# Patient Record
Sex: Female | Born: 1980 | Race: White | Hispanic: No | Marital: Single | Smoking: Current every day smoker
Health system: Southern US, Community
[De-identification: ages and names within clinical notes are randomized; demographics above are authoritative.]

## PROBLEM LIST (undated history)

## (undated) DIAGNOSIS — N289 Disorder of kidney and ureter, unspecified: Secondary | ICD-10-CM

## (undated) HISTORY — PX: OTHER SURGICAL HISTORY: SHX169

---

## 2014-11-14 ENCOUNTER — Emergency Department (HOSPITAL_COMMUNITY): Payer: Medicaid Other

## 2014-11-14 ENCOUNTER — Encounter (HOSPITAL_COMMUNITY): Payer: Self-pay | Admitting: Emergency Medicine

## 2014-11-14 ENCOUNTER — Emergency Department (HOSPITAL_COMMUNITY)
Admission: EM | Admit: 2014-11-14 | Discharge: 2014-11-14 | Disposition: A | Discharge status: Home / Self Care | Payer: Medicaid Other | Attending: Emergency Medicine | Admitting: Emergency Medicine

## 2014-11-14 DIAGNOSIS — R0789 Other chest pain: Secondary | ICD-10-CM | POA: Diagnosis not present

## 2014-11-14 DIAGNOSIS — R079 Chest pain, unspecified: Secondary | ICD-10-CM | POA: Diagnosis present

## 2014-11-14 MED ORDER — IBUPROFEN 800 MG PO TABS
800.0000 mg | ORAL_TABLET | Freq: Once | ORAL | Status: AC
  Administered 2014-11-14: 800 mg via ORAL
  Filled 2014-11-14: qty 1

## 2014-11-14 NOTE — Discharge Instructions (Signed)
Chest Pain (Nonspecific) Contact your local health department to get a primary care physician. Ask your new primary care physician to help you to stop smoking. Take Tylenol or Advil as directed for pain It is often hard to give a diagnosis for the cause of chest pain. There is always a chance that your pain could be related to something serious, such as a heart attack or a blood clot in the lungs. You need to follow up with your doctor. HOME CARE  If antibiotic medicine was given, take it as directed by your doctor. Finish the medicine even if you start to feel better.  For the next few days, avoid activities that bring on chest pain. Continue physical activities as told by your doctor.  Do not use any tobacco products. This includes cigarettes, chewing tobacco, and e-cigarettes.  Avoid drinking alcohol.  Only take medicine as told by your doctor.  Follow your doctor's suggestions for more testing if your chest pain does not go away.  Keep all doctor visits you made. GET HELP IF:  Your chest pain does not go away, even after treatment.  You have a rash with blisters on your chest.  You have a fever. GET HELP RIGHT AWAY IF:   You have more pain or pain that spreads to your arm, neck, jaw, back, or belly (abdomen).  You have shortness of breath.  You cough more than usual or cough up blood.  You have very bad back or belly pain.  You feel sick to your stomach (nauseous) or throw up (vomit).  You have very bad weakness.  You pass out (faint).  You have chills. This is an emergency. Do not wait to see if the problems will go away. Call your local emergency services (911 in U.S.). Do not drive yourself to the hospital. MAKE SURE YOU:   Understand these instructions.  Will watch your condition.  Will get help right away if you are not doing well or get worse. Document Released: 09/14/2007 Document Revised: 04/02/2013 Document Reviewed: 09/14/2007 [facility] Patient  Information 2015 [place], [place]. This information is not intended to replace advice given to you by your health care provider. Make sure you discuss any questions you have with your health care provider.

## 2014-11-14 NOTE — ED Notes (Signed)
Pt states that she has been having chest pain for over a month.  States feels that the shortness of breath has gotten worse over the past week.  Recently drove from [place].

## 2014-11-14 NOTE — ED Provider Notes (Signed)
CSN: [id number]     Arrival date & time 11/14/14  1000 History   This chart was scribed for Doug Sou, MD by Evon Slack, ED Scribe. This patient was seen in room APA04/APA04 and the patient's care was started at 10:06 AM.      Chief Complaint  Patient presents with  . Chest Pain   The history is provided by the patient. No language interpreter was used.   HPI Comments: Brytani Voth is a 34 y.o. female who presents to the Emergency Department complaining of pinching CP onset 1 month prior. Pt states that she does have some intermittent tingling in her left hand. Pt states that she did notice pain is pleuritic.  Pt states that the pain is worse at night. Constant for one month. Denies sob or cough . Pain covers approximately 2 cm area of her left anterior chest. Pt states that she has been taking tylenol codeine and ibuprofen with slight relief. Pt does report recent drive from [place] 2 days ago. Pt states she has a Hx of chest wall pain since the age of 62. Hx of kidney stones. Tobacco and alcohol use occasionally. No illicit drug use. LMP 10/22/2014  No past medical history on file. past medical history chest wall pain No past surgical history on file. family history negative for coronary artery disease No family history on file. History  Substance Use Topics  . Smoking status: Not on file  . Smokeless tobacco: Not on file  . Alcohol Use: Not on file   positive smoker occasional alcohol no illicit drug use OB History    No data available     Review of Systems  Constitutional: Negative.   HENT: Negative.   Cardiovascular: Positive for chest pain.  Gastrointestinal: Negative.   Musculoskeletal: Negative.   Skin: Negative.   Neurological: Negative.   Psychiatric/Behavioral: Negative.   All other systems reviewed and are negative.    Allergies  Review of patient's allergies indicates not on file.  Home Medications   Prior to Admission medications   Not on File   BP  115/77 mmHg  Pulse 94  Temp(Src) 98 F (36.7 C) (Oral)  Resp 16  Ht 5\' 6"  (1.676 m)  Wt 154 lb (69.854 kg)  BMI 24.87 kg/m2  SpO2 97%  LMP 10/22/2014   Physical Exam  Constitutional: She appears well-developed and well-nourished.  HENT:  Head: Normocephalic and atraumatic.  Eyes: Conjunctivae are normal. Pupils are equal, round, and reactive to light.  Neck: Neck supple. No tracheal deviation present. No thyromegaly present.  Cardiovascular: Normal rate and regular rhythm.   No murmur heard. Pulmonary/Chest: Effort normal and breath sounds normal. She exhibits no tenderness.  Tender left anterior chest wall, reproducing pain exactly. Pain is also reproduced by forcible flexion of left shoulder.  Abdominal: Soft. Bowel sounds are normal. She exhibits no distension. There is no tenderness.  Musculoskeletal: Normal range of motion. She exhibits no edema or tenderness.  Neurological: She is alert. Coordination normal.  Skin: Skin is warm and dry. No rash noted.  Psychiatric: She has a normal mood and affect.  Nursing note and vitals reviewed.   ED Course  Procedures (including critical care time) DIAGNOSTIC STUDIES: Oxygen Saturation is 97% on RA, normal by my interpretation.    COORDINATION OF CARE: 10:18 AM-Discussed treatment plan with pt at bedside and pt agreed to plan.     Labs Review Labs Reviewed - No data to display  Imaging Review Dg Chest 2  View  11/14/2014   CLINICAL DATA:  Chest pain.  Tingling in the left fingers.  EXAM: CHEST - 2 VIEW  COMPARISON:  Two-view chest radiograph 01/04/2011.  FINDINGS: The heart size and mediastinal contours are within normal limits. Both lungs are clear. The visualized skeletal structures are unremarkable.  IMPRESSION: Negative two view chest x-ray   Electronically Signed   By: Marin Roberts M.D.   On: 11/14/2014 10:44     EKG Interpretation   Date/Time:  Friday November 14 2014 10:09:28 EDT Ventricular Rate:  92 PR  Interval:  128 QRS Duration: 103 QT Interval:  359 QTC Calculation: 444 R Axis:   66 Text Interpretation:  Sinus rhythm RSR' in V1 or V2, right VCD or RVH No  old tracing to compare Confirmed by Johm Pfannenstiel  MD, Negar Sieler [phone]) on  11/14/2014 10:18:05 AM     11:40 AM feels improved after treatment with ibuprofen. I counseled patient for 5 minutes on smoking cessation Chest x-ray viewed by me No results found for this or any previous visit. Dg Chest 2 View  11/14/2014   CLINICAL DATA:  Chest pain.  Tingling in the left fingers.  EXAM: CHEST - 2 VIEW  COMPARISON:  Two-view chest radiograph 01/04/2011.  FINDINGS: The heart size and mediastinal contours are within normal limits. Both lungs are clear. The visualized skeletal structures are unremarkable.  IMPRESSION: Negative two view chest x-ray   Electronically Signed   By: Marin Roberts M.D.   On: 11/14/2014 10:44    MDM  PERC neg. low pretest clinical suspicion for pulmonary embolism. Doubt acute coronary syndrome. Highly atypical symptoms. Near normal EKG. Only cardiac risk factor smoking Final diagnoses:  None   Plan Tylenol or Advil for pain. Referral to local health department to get primary care physician Plan Diagnosis #1 atypical chest pain #2 tobacco abuse    I personally performed the services described in this documentation, which was scribed in my presence. The recorded information has been reviewed and considered.   Doug Sou, MD 11/14/14 1144

## 2014-11-17 ENCOUNTER — Emergency Department (HOSPITAL_COMMUNITY)
Admission: EM | Admit: 2014-11-17 | Discharge: 2014-11-17 | Disposition: A | Discharge status: Home / Self Care | Payer: Medicaid Other | Attending: Emergency Medicine | Admitting: Emergency Medicine

## 2014-11-17 ENCOUNTER — Encounter (HOSPITAL_COMMUNITY): Payer: Self-pay | Admitting: Emergency Medicine

## 2014-11-17 DIAGNOSIS — Z720 Tobacco use: Secondary | ICD-10-CM | POA: Insufficient documentation

## 2014-11-17 DIAGNOSIS — R1032 Left lower quadrant pain: Secondary | ICD-10-CM | POA: Insufficient documentation

## 2014-11-17 DIAGNOSIS — Z87448 Personal history of other diseases of urinary system: Secondary | ICD-10-CM | POA: Diagnosis not present

## 2014-11-17 DIAGNOSIS — R350 Frequency of micturition: Secondary | ICD-10-CM | POA: Diagnosis not present

## 2014-11-17 DIAGNOSIS — R0789 Other chest pain: Secondary | ICD-10-CM | POA: Insufficient documentation

## 2014-11-17 DIAGNOSIS — R1031 Right lower quadrant pain: Secondary | ICD-10-CM | POA: Insufficient documentation

## 2014-11-17 DIAGNOSIS — R0602 Shortness of breath: Secondary | ICD-10-CM | POA: Insufficient documentation

## 2014-11-17 DIAGNOSIS — R079 Chest pain, unspecified: Secondary | ICD-10-CM | POA: Diagnosis present

## 2014-11-17 HISTORY — DX: Disorder of kidney and ureter, unspecified: N28.9

## 2014-11-17 LAB — CBC WITH DIFFERENTIAL/PLATELET
BASOS ABS: 0 10*3/uL (ref 0.0–0.1)
Basophils Relative: 0 % (ref 0–1)
EOS ABS: 0.1 10*3/uL (ref 0.0–0.7)
EOS PCT: 1 % (ref 0–5)
HCT: 37.5 % (ref 36.0–46.0)
Hemoglobin: 12.3 g/dL (ref 12.0–15.0)
Lymphocytes Relative: 34 % (ref 12–46)
Lymphs Abs: 2 10*3/uL (ref 0.7–4.0)
MCH: 30.4 pg (ref 26.0–34.0)
MCHC: 32.8 g/dL (ref 30.0–36.0)
MCV: 92.8 fL (ref 78.0–100.0)
Monocytes Absolute: 0.3 10*3/uL (ref 0.1–1.0)
Monocytes Relative: 5 % (ref 3–12)
NEUTROS ABS: 3.5 10*3/uL (ref 1.7–7.7)
Neutrophils Relative %: 60 % (ref 43–77)
Platelets: 211 10*3/uL (ref 150–400)
RBC: 4.04 MIL/uL (ref 3.87–5.11)
RDW: 12.6 % (ref 11.5–15.5)
WBC: 5.8 10*3/uL (ref 4.0–10.5)

## 2014-11-17 LAB — BASIC METABOLIC PANEL
ANION GAP: 9 (ref 5–15)
BUN: 11 mg/dL (ref 6–20)
CALCIUM: 9.2 mg/dL (ref 8.9–10.3)
CHLORIDE: 102 mmol/L (ref 101–111)
CO2: 27 mmol/L (ref 22–32)
CREATININE: 0.65 mg/dL (ref 0.44–1.00)
GFR calc non Af Amer: >60 mL/min (ref >60)
GLUCOSE: 104 mg/dL — AB (ref 65–99)
Potassium: 3.6 mmol/L (ref 3.5–5.1)
SODIUM: 138 mmol/L (ref 135–145)

## 2014-11-17 LAB — TROPONIN I: Troponin I: <0.03 ng/mL (ref <0.031)

## 2014-11-17 MED ORDER — NAPROXEN 500 MG PO TABS: 500.0000 mg | ORAL_TABLET | Freq: Two times a day (BID) | ORAL | Status: AC

## 2014-11-17 MED ORDER — NAPROXEN 250 MG PO TABS
500.0000 mg | ORAL_TABLET | Freq: Once | ORAL | Status: AC
  Administered 2014-11-17: 500 mg via ORAL
  Filled 2014-11-17: qty 2

## 2014-11-17 NOTE — Discharge Instructions (Signed)
Chest Wall Pain Chest wall pain is pain felt in or around the chest bones and muscles. It may take up to 6 weeks to get better. It may take longer if you are active. Chest wall pain can happen on its own. Other times, things like germs, injury, coughing, or exercise can cause the pain. HOME CARE   Avoid activities that make you tired or cause pain. Try not to use your chest, belly (abdominal), or side muscles. Do not use heavy weights.  Put ice on the sore area.  Put ice in a plastic bag.  Place a towel between your skin and the bag.  Leave the ice on for 15-20 minutes for the first 2 days.  Only take medicine as told by your doctor. GET HELP RIGHT AWAY IF:   You have more pain or are very uncomfortable.  You have a fever.  Your chest pain gets worse.  You have new problems.  You feel sick to your stomach (nauseous) or throw up (vomit).  You start to sweat or feel lightheaded.  You have a cough with mucus (phlegm).  You cough up blood. MAKE SURE YOU:   Understand these instructions.  Will watch your condition.  Will get help right away if you are not doing well or get worse. Document Released: 09/14/2007 Document Revised: 06/20/2011 Document Reviewed: 11/22/2010 [facility] Patient Information 2015 [place], [place]. This information is not intended to replace advice given to you by your health care provider. Make sure you discuss any questions you have with your health care provider.  Take the Naprosyn as directed for the next 7 days. Resource guide provided below if symptoms have not improved will need to find a physician to follow-up with. Return for any new or worse symptoms.   Emergency Department Resource Guide 1) Find a Doctor and Pay Out of Pocket Although you won't have to find out who is covered by your insurance plan, it is a good idea to ask around and get recommendations. You will then need to call the office and see if the doctor you have chosen will accept you  as a new patient and what types of options they offer for patients who are self-pay. Some doctors offer discounts or will set up payment plans for their patients who do not have insurance, but you will need to ask so you aren't surprised when you get to your appointment.  2) Contact Your Local Health Department Not all health departments have doctors that can see patients for sick visits, but many do, so it is worth a call to see if yours does. If you don't know where your local health department is, you can check in your phone book. The CDC also has a tool to help you locate your state's health department, and many state websites also have listings of all of their local health departments.  3) Find a Walk-in Clinic If your illness is not likely to be very severe or complicated, you may want to try a walk in clinic. These are popping up all over the country in pharmacies, drugstores, and shopping centers. They're usually staffed by nurse practitioners or physician assistants that have been trained to treat common illnesses and complaints. They're usually fairly quick and inexpensive. However, if you have serious medical issues or chronic medical problems, these are probably not your best option.  No Primary Care Doctor: - Call Health Connect at  [phone] - they can help you locate a primary care doctor that  accepts your insurance, provides certain services, etc. - Physician Referral Service- [phone]  Chronic Pain Problems: Organization         Address  Phone   Notes  [facility]  [phone] Patients need to be referred by their primary care doctor.   Medication Assistance: Organization         Address  Phone   Notes  [facility] Medication [facility] [address] [place] [place], [place] [address] [phone] --Must be a resident of [facility] -- Must have NO insurance coverage whatsoever (no Medicaid/ Medicare, etc.) -- The pt. MUST have  a primary care doctor that directs their care regularly and follows them in the community   [facility]ist  [phone]   [employer]  [phone]    Agencies that provide inexpensive medical care: Organization         Address  Phone   Notes  [facility]  [phone]   [facility]    [phone]   [facility] [address] [place], [place] [address] [phone]   [facility] [address] [place], [place] [phone]   Planned Parenthood    [phone]   [facility]    [phone]   [facility]  [address], [place] Phone:  [phone], Fax:  [phone] Hours of Operation:  9 am - 6 pm, M-F.  Also accepts Medicaid/Medicare and self-pay.  [facility]  [address], [place] Phone: [phone], Fax: [phone]. Hours of Operation:  8:30 am - 5:30 pm, M-F.  Also accepts Medicaid and self-pay.  [facility] [place] [address], [place] Phone: [phone]   [facility] [address] Natasha Bence [place], [place] [phone], Ext. [phone] Mondays & Thursdays: 7-9 AM.  First 15 patients are seen on a first come, first serve basis.    Medicaid-accepting [facility] Providers:  Organization         Address  Phone   Notes  [facility] [address], [place] [phone] Also accepts self-pay patients.  [facility] [address] Laurell Josephs [place], [place]  [phone]   [facility] [address], [place] [phone]   [facility] [address] [place] [phone]   Renaye Rakers [address], [place]   [phone] Only accepts [place] patients after they have their name applied to their card.   Self-Pay (no insurance) in [facility]:  Organization         Address  Phone   Notes  Sickle Cell Patients, [facility] Internal Medicine [address] [place], [place] [phone]   [facility] [address] [place], [place] [phone]   [facility] [place]  [address], [place] [phone]   [facility]/Dr. Osei-Bonsu  [address] [place] or [address], [place] [phone] Phone number for both [place] and [place] locations is the same.  [facility] [address] [place] [phone]   [facility] [address] [place] or [address] [phone]   [facility] [address], [place] [phone], phone; [phone], fax Sees patients 1st and 3rd Saturday of every month.  Must not qualify for public or private insurance (i.e. Medicaid, Medicare, [place] Health Choice, Veterans' Benefits)  Household income should be no more than 200% of the poverty level The clinic cannot treat you if you are pregnant or think you are pregnant  Sexually transmitted diseases are not treated at the clinic.    Dental Care: Organization         Address  Phone  Notes  [facility] Department of [place] [facility] [address] [place], [place] [phone] Accepts children up to age 78 who are enrolled in [place] or [place] Health Choice; pregnant women with a Medicaid card; and children who have applied for Medicaid or [place] Health Choice, but were declined, whose parents can pay a reduced fee at time of service.  [facility] Department of [facility]  [address] [place] [address], [place] [phone] Accepts children up to age 57 who are enrolled in [place] or [place] Health Choice; pregnant women with a Medicaid card; and children who have applied for Medicaid or  Health Choice, but were declined, whose parents can  pay a reduced fee at time of service.  [facility]  [address] [place], [place] [phone] Patients are seen by appointment only. Walk-ins are not accepted. [facility] will see patients 61 years of age and older. Monday - Tuesday (8am-5pm) Most Wednesdays (8:30-5pm) $30 per visit, cash only  [facility]  [address], [facility] [phone] Patients are seen by appointment only. Walk-ins are not accepted. [facility] will see patients 91 years of age and older. One Wednesday Evening (Monthly: Volunteer Based).  $30 per visit, cash only  [employer]  [phone] for adults; Children under age 74, call Graduate Pediatric Dentistry at [phone]. Children aged 74-14, please call [phone] to request a pediatric application.  Dental services are provided in all areas of dental care including fillings, crowns and bridges, complete and partial dentures, implants, gum treatment, root canals, and extractions. Preventive care is also provided. Treatment is provided to both adults and children. Patients are selected via a lottery and there is often a waiting list.   [facility] [address] [place]  [phone] [email or web address]   [facility] [address] [place], [place] [phone], Ext. [phone] Second and Fourth Thursday of each month, opens at 6:30 AM; Clinic ends at 9 AM.  Patients are seen on a first-come first-served basis, and a limited number are seen during each clinic.   [facility]  [address] Ether Griffins [place], [place] [phone]   Eligibility Requirements You must have lived in [place], [place], or [place] counties for at least the last three months.   You cannot be eligible for state or federal sponsored [place], including [employer], [place], or [employer].   You generally cannot be eligible for healthcare  insurance through your employer.    How to apply: Eligibility screenings are held every Tuesday and Wednesday afternoon from 1:00 pm until 4:00 pm. You do not need an appointment for the interview!  [facility] [address] [place], [place] [phone]   [facility] Health Department  [phone]   [facility] Health Department  [phone]   [facility] Department  [phone]    Behavioral Health Resources in the Community: Intensive Outpatient Programs Organization         Address  Phone  Notes  [facility] Services [address] [place], [place] [phone]   [facility] Outpatient [address] [place], [place] [phone]   [facility] [address] [place], [place]  [phone]   [facility] [address] [place] [address]  [place], [place] [phone] or [phone]   Substance Abuse Resources Organization         Address  Phone  Notes  [facility]  [phone]   [facility]  [phone]   [facility]  [phone]   Floydene Flock  [phone]   Residential & Outpatient Substance Abuse Program  [phone]   Psychological Services Organization         Address  Phone  Notes  [facility] Behavioral Health  [phone]   [facility]  [phone]   [facility] Mental Health [address], [place] [phone] or [phone]    Mobile Crisis Teams Organization         Address  Phone  Notes  [facility], Mobile Crisis Care Unit  [phone]   [facility]  [address] [place], [place] [phone]   Doristine Locks [address] [place] [phone]    Self-Help/Support Groups Organization         Address  Phone             Notes  [facility] [place] - variety of support groups  336- I7437963 Call for more information  Narcotics Anonymous (NA),  [facility] [address], [employer] [place]  2 meetings at this location   Statistician         Address  Phone  Notes  [facility] [address],    [place]  [phone]   [facility]  [address] [place] [address], [place], [place] [phone]   [facility] [address] [place], [place] [phone] Admissions: 8am-3pm M-F  [facility] [address],    [place], [place] [phone]   [facility] [address] [place], [place], [place] [phone]   [facility] [address],  [place], [place] [phone]   [facility] - Intensive Outpatient [address] Laurell Josephs [address], [place], [place] [phone]   [facility] ([facility] [address]  [place], [place] [phone] or [phone]   [facility] ([facility]) [address], [place], [place] [phone] Accepts Medicaid  [facility] [place] [address],  [place] [phone] Substance Abuse/Addiction Treatment   [facility] [facility] Organization         Address  Phone  Notes  [facility]  [phone]   Angie Fava, PhD [address] Ervin Knack [place], [place]   [phone] or [phone]   [facility] Behavioral   [address] [place], [place] [phone]   [facility] [address] [place], [place] [phone] Insurance/Medicaid/sponsorship through [employer] and Families [address]                                    [place], [place] [phone] Therapy/tele-psych/case  [facility] [address] [place]  [address]   [place], [place] [phone]    Dr. Lolly Mustache  [phone]   [facility]  [employer] [facility] Dept. 1) [address], [place] 2) [address] [place] 3)  [address], [place] [phone]  [phone]    [facility] Child Abuse Hotline [phone] or 8706024418 (After Hours)

## 2014-11-17 NOTE — ED Provider Notes (Signed)
CSN: [id number]     Arrival date & time 11/17/14  1812 History  This chart was scribed for Vanetta Mulders, MD by Budd Palmer, ED Scribe. This patient was seen in room APA03/[facility]03 and the patient's care was started at 9:38 PM.    Chief Complaint  Patient presents with  . Chest Pain   The history is provided by the patient. No language interpreter was used.   HPI Comments: Kara Welch is a 34 y.o. female who presents to the Emergency Department complaining of pinching, worsening, left-sided CP onset 1 month ago. She currently rates it as a 8-9/10. She reports associated SOB, bilateral lower abdominal pain, and urinary frequency. Pt has been to the ED on 8/5 for the same. She has taken ibuprofen, codeine, and tylenol with no relief. She states the pain is exacerbated by lying supine and from heavy lifting. She has had chest wall pain in the past, but states it was not as severe. Pt denies fever, chills, visual disturbance, cold-symptoms, n/v/d, dysuria, rash, bleeding and bruising, leg swelling, as well as HA.  Past Medical History  Diagnosis Date  . Renal disorder    Past Surgical History  Procedure Laterality Date  . Kidney stent     History reviewed. No pertinent family history. History  Substance Use Topics  . Smoking status: Current Every Day Smoker -- 1.00 packs/day    Types: Cigarettes  . Smokeless tobacco: Not on file  . Alcohol Use: Yes     Comment: occasional   OB History    No data available     Review of Systems  Constitutional: Negative for fever and chills.  HENT: Negative for rhinorrhea.   Eyes: Negative for visual disturbance.  Respiratory: Positive for shortness of breath. Negative for cough.   Cardiovascular: Positive for chest pain. Negative for leg swelling.  Gastrointestinal: Positive for abdominal pain. Negative for nausea, vomiting and diarrhea.  Genitourinary: Positive for frequency. Negative for dysuria.  Skin: Negative for rash.  Neurological:  Negative for headaches.  Hematological: Does not bruise/bleed easily.  Psychiatric/Behavioral: Negative for confusion.    Allergies  Other  Home Medications   Prior to Admission medications   Medication Sig Start Date End Date Taking? Authorizing Provider  acetaminophen-codeine (TYLENOL #3) 300-30 MG per tablet Take 1 tablet by mouth every 4 (four) hours as needed for moderate pain.   Yes Historical Provider, MD  ibuprofen (ADVIL,MOTRIN) 800 MG tablet Take 800 mg by mouth every 8 (eight) hours as needed for mild pain.   Yes Historical Provider, MD  naproxen (NAPROSYN) 500 MG tablet Take 1 tablet (500 mg total) by mouth 2 (two) times daily. 11/17/14   Vanetta Mulders, MD   BP 103/70 mmHg  Pulse 60  Temp(Src) 98 F (36.7 C) (Oral)  Resp 11  Ht 5\' 6"  (1.676 m)  Wt 154 lb (69.854 kg)  BMI 24.87 kg/m2  SpO2 98%  LMP 10/17/2014 (Exact Date) Physical Exam  Constitutional: She is oriented to person, place, and time. She appears well-developed and well-nourished. No distress.  HENT:  Head: Normocephalic and atraumatic.  Mouth/Throat: Oropharynx is clear and moist.  Eyes: Conjunctivae and EOM are normal. Pupils are equal, round, and reactive to light. No scleral icterus.  Neck: Normal range of motion. Neck supple. No tracheal deviation present.  Cardiovascular: Normal rate, regular rhythm, normal heart sounds and intact distal pulses.   No murmur heard. Pulmonary/Chest: Effort normal and breath sounds normal. No respiratory distress. She has no wheezes. She  has no rales. She exhibits tenderness.  TTP to left anterior chest  Abdominal: Soft. Bowel sounds are normal. There is no tenderness.  Musculoskeletal: Normal range of motion.  No swelling in the ankles  Neurological: She is alert and oriented to person, place, and time. No cranial nerve deficit. She exhibits normal muscle tone. Coordination normal.  Skin: Skin is warm and dry.  Psychiatric: She has a normal mood and affect. Her  behavior is normal.  Nursing note and vitals reviewed.   ED Course  Procedures  DIAGNOSTIC STUDIES: Oxygen Saturation is 98% on RA, normal by my interpretation.    COORDINATION OF CARE: 9:49 PM - Discussed diagnostic studies from today and from 8/5, and possible chest wall pain. Discussed normal EKG unchanged from 3 days ago. Discussed plans to order naproxen. Pt advised of plan for treatment and pt agrees.  Labs Review Labs Reviewed  BASIC METABOLIC PANEL - Abnormal; Notable for the following:    Glucose, Bld 104 (*)    All other components within normal limits  TROPONIN I  CBC WITH DIFFERENTIAL/PLATELET    Imaging Review No results found.   EKG Interpretation   Date/Time:  Monday November 17 2014 18:19:13 EDT Ventricular Rate:  71 PR Interval:  124 QRS Duration: 104 QT Interval:  400 QTC Calculation: 434 R Axis:   63 Text Interpretation:  Normal sinus rhythm with sinus arrhythmia Incomplete  right bundle branch block Borderline ECG No significant change since last  tracing Confirmed by Luciann Gossett  MD, Mirtha Jain ([id number]) on 11/17/2014 6:30:13 PM      MDM   Final diagnoses:  Chest wall pain    Patient symptoms are consistent with chest wall pain. His refusal pain on the left side of the chest also made worse with movement or picking things up with the left arm. Patient had chest x-ray just on August 5 without any acute findings to not repeated here today. Patient's labs here negative troponin no significant CBC or electrolyte abnormalities. EKG had no acute changes no sniffing change his last EKG. Patient has not been on a steady course of NSAIDs will start Naprosyn have her take it for the next 7 days. Resource guide provided to help her find a regular doctor.  I personally performed the services described in this documentation, which was scribed in my presence. The recorded information has been reviewed and is accurate.    Vanetta Mulders, MD 11/17/14 [phone]

## 2014-11-17 NOTE — ED Notes (Signed)
Pt states that she has been having cp off and on for a few weeks - started around 0230am - lt chest / breast pain with tinglng that radiates down into her fingers and elbow - Claims to have a a little bit of SOB

## 2014-12-01 ENCOUNTER — Emergency Department (HOSPITAL_COMMUNITY)
Admission: EM | Admit: 2014-12-01 | Discharge: 2014-12-01 | Disposition: A | Discharge status: Home / Self Care | Payer: Medicaid Other | Attending: Emergency Medicine | Admitting: Emergency Medicine

## 2014-12-01 ENCOUNTER — Emergency Department (HOSPITAL_COMMUNITY): Payer: Medicaid Other

## 2014-12-01 ENCOUNTER — Encounter (HOSPITAL_COMMUNITY): Payer: Self-pay | Admitting: *Deleted

## 2014-12-01 DIAGNOSIS — N83201 Unspecified ovarian cyst, right side: Secondary | ICD-10-CM

## 2014-12-01 DIAGNOSIS — N8329 Other ovarian cysts: Secondary | ICD-10-CM | POA: Insufficient documentation

## 2014-12-01 DIAGNOSIS — Z791 Long term (current) use of non-steroidal anti-inflammatories (NSAID): Secondary | ICD-10-CM | POA: Diagnosis not present

## 2014-12-01 DIAGNOSIS — Z3202 Encounter for pregnancy test, result negative: Secondary | ICD-10-CM | POA: Diagnosis not present

## 2014-12-01 DIAGNOSIS — Z87442 Personal history of urinary calculi: Secondary | ICD-10-CM | POA: Diagnosis not present

## 2014-12-01 DIAGNOSIS — Z720 Tobacco use: Secondary | ICD-10-CM | POA: Insufficient documentation

## 2014-12-01 DIAGNOSIS — R1030 Lower abdominal pain, unspecified: Secondary | ICD-10-CM

## 2014-12-01 DIAGNOSIS — R109 Unspecified abdominal pain: Secondary | ICD-10-CM | POA: Diagnosis present

## 2014-12-01 LAB — COMPREHENSIVE METABOLIC PANEL
ALT: 22 U/L (ref 14–54)
AST: 23 U/L (ref 15–41)
Albumin: 4.4 g/dL (ref 3.5–5.0)
Alkaline Phosphatase: 38 U/L (ref 38–126)
Anion gap: 6 (ref 5–15)
BUN: 15 mg/dL (ref 6–20)
CO2: 28 mmol/L (ref 22–32)
Calcium: 9.4 mg/dL (ref 8.9–10.3)
Chloride: 104 mmol/L (ref 101–111)
Creatinine, Ser: 0.57 mg/dL (ref 0.44–1.00)
GFR calc Af Amer: >60 mL/min (ref >60)
GFR calc non Af Amer: >60 mL/min (ref >60)
Glucose, Bld: 88 mg/dL (ref 65–99)
Potassium: 3.9 mmol/L (ref 3.5–5.1)
Sodium: 138 mmol/L (ref 135–145)
Total Bilirubin: 0.5 mg/dL (ref 0.3–1.2)
Total Protein: 7.6 g/dL (ref 6.5–8.1)

## 2014-12-01 LAB — CBC WITH DIFFERENTIAL/PLATELET
Basophils Absolute: 0 10*3/uL (ref 0.0–0.1)
Basophils Relative: 0 % (ref 0–1)
Eosinophils Absolute: 0 10*3/uL (ref 0.0–0.7)
Eosinophils Relative: 0 % (ref 0–5)
HCT: 37.9 % (ref 36.0–46.0)
Hemoglobin: 12.7 g/dL (ref 12.0–15.0)
Lymphocytes Relative: 28 % (ref 12–46)
Lymphs Abs: 1.5 10*3/uL (ref 0.7–4.0)
MCH: 30.8 pg (ref 26.0–34.0)
MCHC: 33.5 g/dL (ref 30.0–36.0)
MCV: 92 fL (ref 78.0–100.0)
Monocytes Absolute: 0.3 10*3/uL (ref 0.1–1.0)
Monocytes Relative: 7 % (ref 3–12)
Neutro Abs: 3.3 10*3/uL (ref 1.7–7.7)
Neutrophils Relative %: 65 % (ref 43–77)
Platelets: 174 10*3/uL (ref 150–400)
RBC: 4.12 MIL/uL (ref 3.87–5.11)
RDW: 12.6 % (ref 11.5–15.5)
WBC: 5.2 10*3/uL (ref 4.0–10.5)

## 2014-12-01 LAB — URINALYSIS, ROUTINE W REFLEX MICROSCOPIC
Bilirubin Urine: NEGATIVE
Glucose, UA: NEGATIVE mg/dL
Hgb urine dipstick: NEGATIVE
Ketones, ur: NEGATIVE mg/dL
Leukocytes, UA: NEGATIVE
Nitrite: NEGATIVE
Protein, ur: NEGATIVE mg/dL
Specific Gravity, Urine: 1.02 (ref 1.005–1.030)
Urobilinogen, UA: 0.2 mg/dL (ref 0.0–1.0)
pH: 6.5 (ref 5.0–8.0)

## 2014-12-01 LAB — PREGNANCY, URINE: Preg Test, Ur: NEGATIVE

## 2014-12-01 MED ORDER — MORPHINE SULFATE (PF) 4 MG/ML IV SOLN
6.0000 mg | Freq: Once | INTRAVENOUS | Status: AC
  Administered 2014-12-01: 6 mg via INTRAVENOUS
  Filled 2014-12-01: qty 2

## 2014-12-01 MED ORDER — IOHEXOL 300 MG/ML  SOLN
50.0000 mL | Freq: Once | INTRAMUSCULAR | Status: AC | PRN
  Administered 2014-12-01: 50 mL via ORAL

## 2014-12-01 MED ORDER — SODIUM CHLORIDE 0.9 % IV BOLUS (SEPSIS)
1000.0000 mL | Freq: Once | INTRAVENOUS | Status: AC
Start: 2014-12-01 — End: 2014-12-01
  Administered 2014-12-01: 1000 mL via INTRAVENOUS

## 2014-12-01 MED ORDER — ONDANSETRON HCL 4 MG/2ML IJ SOLN
4.0000 mg | Freq: Once | INTRAMUSCULAR | Status: AC
  Administered 2014-12-01: 4 mg via INTRAVENOUS
  Filled 2014-12-01: qty 2

## 2014-12-01 MED ORDER — TRAMADOL HCL 50 MG PO TABS: 50.0000 mg | ORAL_TABLET | Freq: Four times a day (QID) | ORAL | Status: AC | PRN

## 2014-12-01 MED ORDER — IOHEXOL 300 MG/ML  SOLN
100.0000 mL | Freq: Once | INTRAMUSCULAR | Status: AC | PRN
  Administered 2014-12-01: 100 mL via INTRAVENOUS

## 2014-12-01 MED ORDER — KETOROLAC TROMETHAMINE 30 MG/ML IJ SOLN
15.0000 mg | Freq: Once | INTRAMUSCULAR | Status: AC
  Administered 2014-12-01: 15 mg via INTRAVENOUS
  Filled 2014-12-01: qty 1

## 2014-12-01 NOTE — ED Notes (Signed)
MD at bedside. 

## 2014-12-01 NOTE — ED Notes (Signed)
Patient reports abd pain x 1 month. Denies v/d. Reports dark blood in stool today.

## 2014-12-01 NOTE — Discharge Instructions (Signed)
Abdominal Pain, Women °Abdominal (stomach, pelvic, or belly) pain can be caused by many things. It is important to tell your doctor: °· The location of the pain. °· Does it come and go or is it present all the time? °· Are there things that start the pain (eating certain foods, exercise)? °· Are there other symptoms associated with the pain (fever, nausea, vomiting, diarrhea)? °All of this is helpful to know when trying to find the cause of the pain. °CAUSES  °· Stomach: virus or bacteria infection, or ulcer. °· Intestine: appendicitis (inflamed appendix), regional ileitis (Crohn's disease), ulcerative colitis (inflamed colon), irritable bowel syndrome, diverticulitis (inflamed diverticulum of the colon), or cancer of the stomach or intestine. °· Gallbladder disease or stones in the gallbladder. °· Kidney disease, kidney stones, or infection. °· Pancreas infection or cancer. °· Fibromyalgia (pain disorder). °· Diseases of the female organs: °¨ Uterus: fibroid (non-cancerous) tumors or infection. °¨ Fallopian tubes: infection or tubal pregnancy. °¨ Ovary: cysts or tumors. °¨ Pelvic adhesions (scar tissue). °¨ Endometriosis (uterus lining tissue growing in the pelvis and on the pelvic organs). °¨ Pelvic congestion syndrome (female organs filling up with blood just before the menstrual period). °¨ Pain with the menstrual period. °¨ Pain with ovulation (producing an egg). °¨ Pain with an IUD (intrauterine device, birth control) in the uterus. °¨ Cancer of the female organs. °· Functional pain (pain not caused by a disease, may improve without treatment). °· Psychological pain. °· Depression. °DIAGNOSIS  °Your doctor will decide the seriousness of your pain by doing an examination. °· Blood tests. °· X-rays. °· Ultrasound. °· CT scan (computed tomography, special type of X-ray). °· MRI (magnetic resonance imaging). °· Cultures, for infection. °· Barium enema (dye inserted in the large intestine, to better view it with  X-rays). °· Colonoscopy (looking in intestine with a lighted tube). °· Laparoscopy (minor surgery, looking in abdomen with a lighted tube). °· Major abdominal exploratory surgery (looking in abdomen with a large incision). °TREATMENT  °The treatment will depend on the cause of the pain.  °· Many cases can be observed and treated at home. °· Over-the-counter medicines recommended by your caregiver. °· Prescription medicine. °· Antibiotics, for infection. °· Birth control pills, for painful periods or for ovulation pain. °· Hormone treatment, for endometriosis. °· Nerve blocking injections. °· Physical therapy. °· Antidepressants. °· Counseling with a psychologist or psychiatrist. °· Minor or major surgery. °HOME CARE INSTRUCTIONS  °· Do not take laxatives, unless directed by your caregiver. °· Take over-the-counter pain medicine only if ordered by your caregiver. Do not take aspirin because it can cause an upset stomach or bleeding. °· Try a clear liquid diet (broth or water) as ordered by your caregiver. Slowly move to a bland diet, as tolerated, if the pain is related to the stomach or intestine. °· Have a thermometer and take your temperature several times a day, and record it. °· Bed rest and sleep, if it helps the pain. °· Avoid sexual intercourse, if it causes pain. °· Avoid stressful situations. °· Keep your follow-up appointments and tests, as your caregiver orders. °· If the pain does not go away with medicine or surgery, you may try: °¨ Acupuncture. °¨ Relaxation exercises (yoga, meditation). °¨ Group therapy. °¨ Counseling. °SEEK MEDICAL CARE IF:  °· You notice certain foods cause stomach pain. °· Your home care treatment is not helping your pain. °· You need stronger pain medicine. °· You want your IUD removed. °· You feel faint or   lightheaded. °· You develop nausea and vomiting. °· You develop a rash. °· You are having side effects or an allergy to your medicine. °SEEK IMMEDIATE MEDICAL CARE IF:  °· Your  pain does not go away or gets worse. °· You have a fever. °· Your pain is felt only in portions of the abdomen. The right side could possibly be appendicitis. The left lower portion of the abdomen could be colitis or diverticulitis. °· You are passing blood in your stools (bright red or black tarry stools, with or without vomiting). °· You have blood in your urine. °· You develop chills, with or without a fever. °· You pass out. °MAKE SURE YOU:  °· Understand these instructions. °· Will watch your condition. °· Will get help right away if you are not doing well or get worse. °Document Released: 01/23/2007 Document Revised: 08/12/2013 Document Reviewed: 02/12/2009 °[employer]® Patient Information ©2015 [employer]. This information is not intended to replace advice given to you by your health care provider. Make sure you discuss any questions you have with your health care provider. ° °

## 2014-12-11 NOTE — ED Provider Notes (Signed)
CSN: [id number]     Arrival date & time 12/01/14  1430 History   First MD Initiated Contact with Patient 12/01/14 1503     Chief Complaint  Patient presents with  . Abdominal Pain     (Consider location/radiation/quality/duration/timing/severity/associated sxs/prior Treatment) HPI  34 year old female with abdominal pain. Onset about a month ago. Waxes and wanes. Feels like it is becoming more frequent and stronger. Of note, patient was evaluated in the emergency room on 8/5 and 8/8. Does not appear from these notes that she mentioned this abdominal pain at that time. She describes the pain is achy and occasionally sharper. No appreciable exacerbating or relieving factors. No urinary complaints. No unusual vaginal bleeding or discharge. No back pain. No nausea or vomiting. No diarrhea.  Past Medical History  Diagnosis Date  . Renal disorder     kidney stone   Past Surgical History  Procedure Laterality Date  . Kidney stent     History reviewed. No pertinent family history. Social History  Substance Use Topics  . Smoking status: Current Every Day Smoker -- 1.00 packs/day    Types: Cigarettes  . Smokeless tobacco: None  . Alcohol Use: Yes     Comment: occasional   OB History    No data available     Review of Systems  All systems reviewed and negative, other than as noted in HPI.   Allergies  Other  Home Medications   Prior to Admission medications   Medication Sig Start Date End Date Taking? Authorizing Provider  acetaminophen-codeine (TYLENOL #3) 300-30 MG per tablet Take 1 tablet by mouth every 4 (four) hours as needed for moderate pain.   Yes Historical Provider, MD  naproxen (NAPROSYN) 500 MG tablet Take 1 tablet (500 mg total) by mouth 2 (two) times daily. 11/17/14  Yes Vanetta Mulders, MD  traMADol (ULTRAM) 50 MG tablet Take 1 tablet (50 mg total) by mouth every 6 (six) hours as needed. 12/01/14   Raeford Razor, MD   BP 114/70 mmHg  Pulse 79  Temp(Src) 98.4 F  (36.9 C) (Oral)  Resp 14  Ht  (1.702 m)  Wt 154 lb (69.854 kg)  BMI 24.11 kg/m2  SpO2 100%  LMP 11/19/2014 Physical Exam  Constitutional: She appears well-developed and well-nourished. No distress.  HENT:  Head: Normocephalic and atraumatic.  Eyes: Conjunctivae are normal. Right eye exhibits no discharge. Left eye exhibits no discharge.  Neck: Neck supple.  Cardiovascular: Normal rate, regular rhythm and normal heart sounds.  Exam reveals no gallop and no friction rub.   No murmur heard. Pulmonary/Chest: Effort normal and breath sounds normal. No respiratory distress.  Abdominal: Soft. She exhibits no distension. There is tenderness.  Mild tenderness in the right lower quadrant without rebound or guarding.  Musculoskeletal: She exhibits no edema or tenderness.  Neurological: She is alert.  Skin: Skin is warm and dry.  Psychiatric: She has a normal mood and affect. Her behavior is normal. Thought content normal.  Nursing note and vitals reviewed.   ED Course  Procedures (including critical care time) Labs Review Labs Reviewed  COMPREHENSIVE METABOLIC PANEL  URINALYSIS, ROUTINE W REFLEX MICROSCOPIC (NOT AT [facility])  PREGNANCY, URINE  CBC WITH DIFFERENTIAL/PLATELET    Imaging Review No results found. I have personally reviewed and evaluated these images and lab results as part of my medical decision-making.   EKG Interpretation None      MDM   Final diagnoses:  Lower abdominal pain  Cyst of right ovary  34 year old female with abdominal pain. Minimal tenderness on exam. Imaging negative for acute abnormality aside from possible ovarian cyst. Not sure this is actually etiology of her pain, but low suspicion for emergent process. She generally appears well. Afebrile nontoxic. Plan symptomatic treatment. Return precautions were discussed.  Dg Chest 2 View  11/14/2014   CLINICAL DATA:  Chest pain.  Tingling in the left fingers.  EXAM: CHEST - 2 VIEW  COMPARISON:   Two-view chest radiograph 01/04/2011.  FINDINGS: The heart size and mediastinal contours are within normal limits. Both lungs are clear. The visualized skeletal structures are unremarkable.  IMPRESSION: Negative two view chest x-ray   Electronically Signed   By: Marin Roberts M.D.   On: 11/14/2014 10:44   Ct Abdomen Pelvis W Contrast  12/01/2014   CLINICAL DATA:  Lower abdominal pain for 1 month.  EXAM: CT ABDOMEN AND PELVIS WITH CONTRAST  TECHNIQUE: Multidetector CT imaging of the abdomen and pelvis was performed using the standard protocol following bolus administration of intravenous contrast.  CONTRAST:  50mL OMNIPAQUE IOHEXOL 300 MG/ML SOLN, OMNIPAQUE IOHEXOL 300 MG/ML SOLN  COMPARISON:  None.  FINDINGS: Lower chest:  No pleural effusion.  The lung bases appear clear.  Hepatobiliary: Faint low-attenuation structure within the right hepatic lobe measures 4 mm, image 20/ series 2. This is too small to characterize. The gallbladder is normal. There is no biliary dilatation.  Pancreas: Normal appearance of the pancreas.  Spleen: Negative  Adrenals/Urinary Tract: Normal adrenal glands. The kidneys are both unremarkable. There is no evidence for obstructive uropathy. The urinary bladder is normal.  Stomach/Bowel: The stomach is within normal limits. The small bowel loops have a normal course and caliber. No obstruction. Normal appearance of the colon. The appendix is visualized and appears normal.  Vascular/Lymphatic: Normal appearance of the abdominal aorta. No enlarged retroperitoneal or mesenteric adenopathy. No enlarged pelvic or inguinal lymph nodes.  Reproductive: The uterus appears normal. There is a right ovary corpus luteal cyst. Calcification within the stress set there is an ossific density within the right adnexa adjacent to the right ovary measuring 1.2 cm, image 44 of series 4. Although no fat attenuation is associated with this structure this may represent and ovarian dermoid.  Other:  There is no ascites or focal fluid collections within the abdomen or pelvis.  Musculoskeletal: No aggressive lytic or sclerotic bone lesions identified. Scoliosis deformity is convex towards the left.  IMPRESSION: 1. No acute findings identified within the abdomen or pelvis. 2. 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   Electronically Signed   By: Signa Kell M.D.   On: 12/01/2014 17:19      Raeford Razor, MD 12/11/14 [phone]

## 2015-02-02 ENCOUNTER — Encounter: Payer: Self-pay | Admitting: Internal Medicine

## 2015-02-19 ENCOUNTER — Ambulatory Visit: Payer: Medicaid Other | Admitting: Gastroenterology

## 2015-03-11 ENCOUNTER — Ambulatory Visit: Payer: Medicaid Other | Admitting: Gastroenterology

## 2017-01-21 IMAGING — DX DG CHEST 2V
2 series · 2 of 2 positions shown · non-contrast
Comparison: Two-view chest radiograph 01/04/2011.

CLINICAL DATA: Chest pain.  Tingling in the left fingers.

EXAM:
CHEST - 2 VIEW

[chest pa]
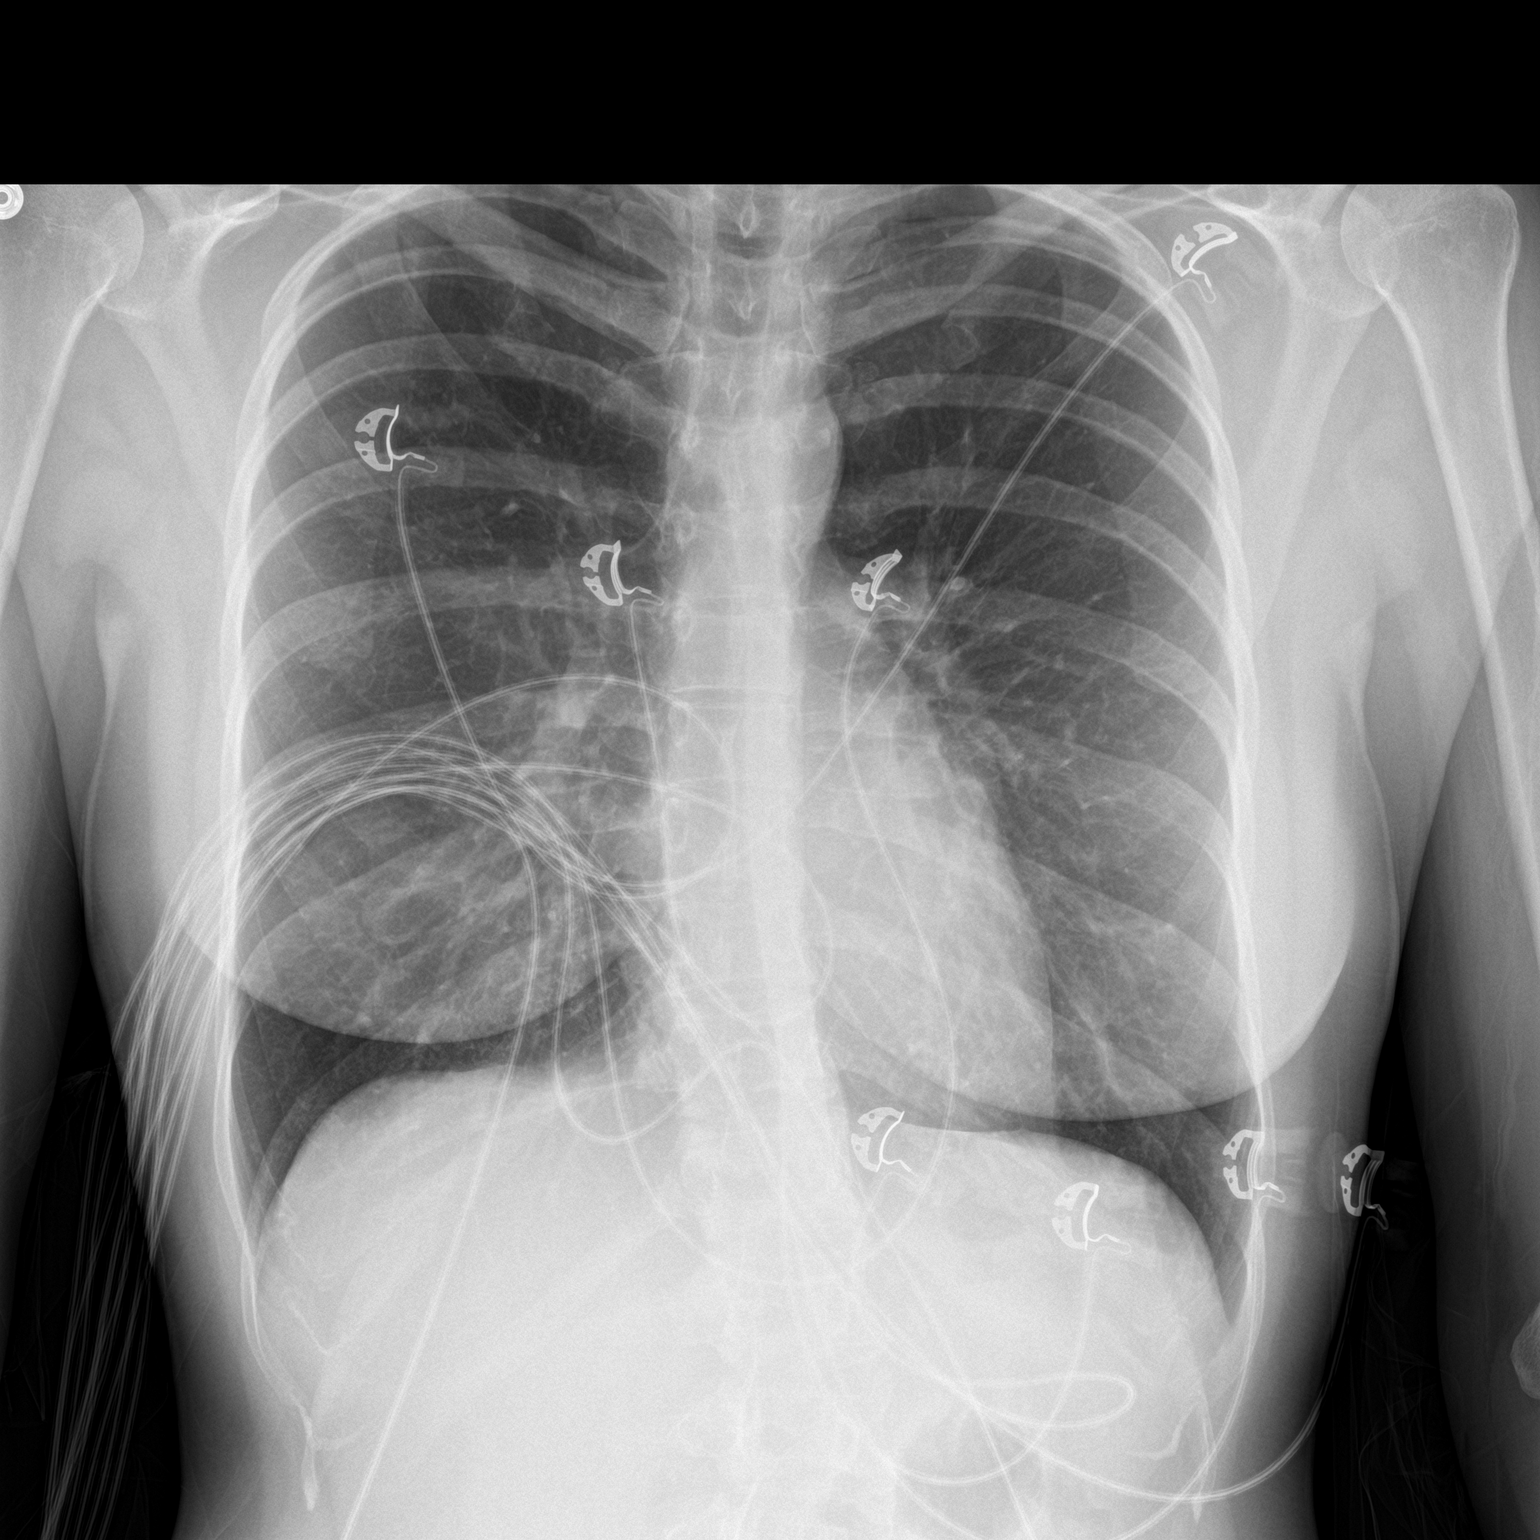

[chest lat]
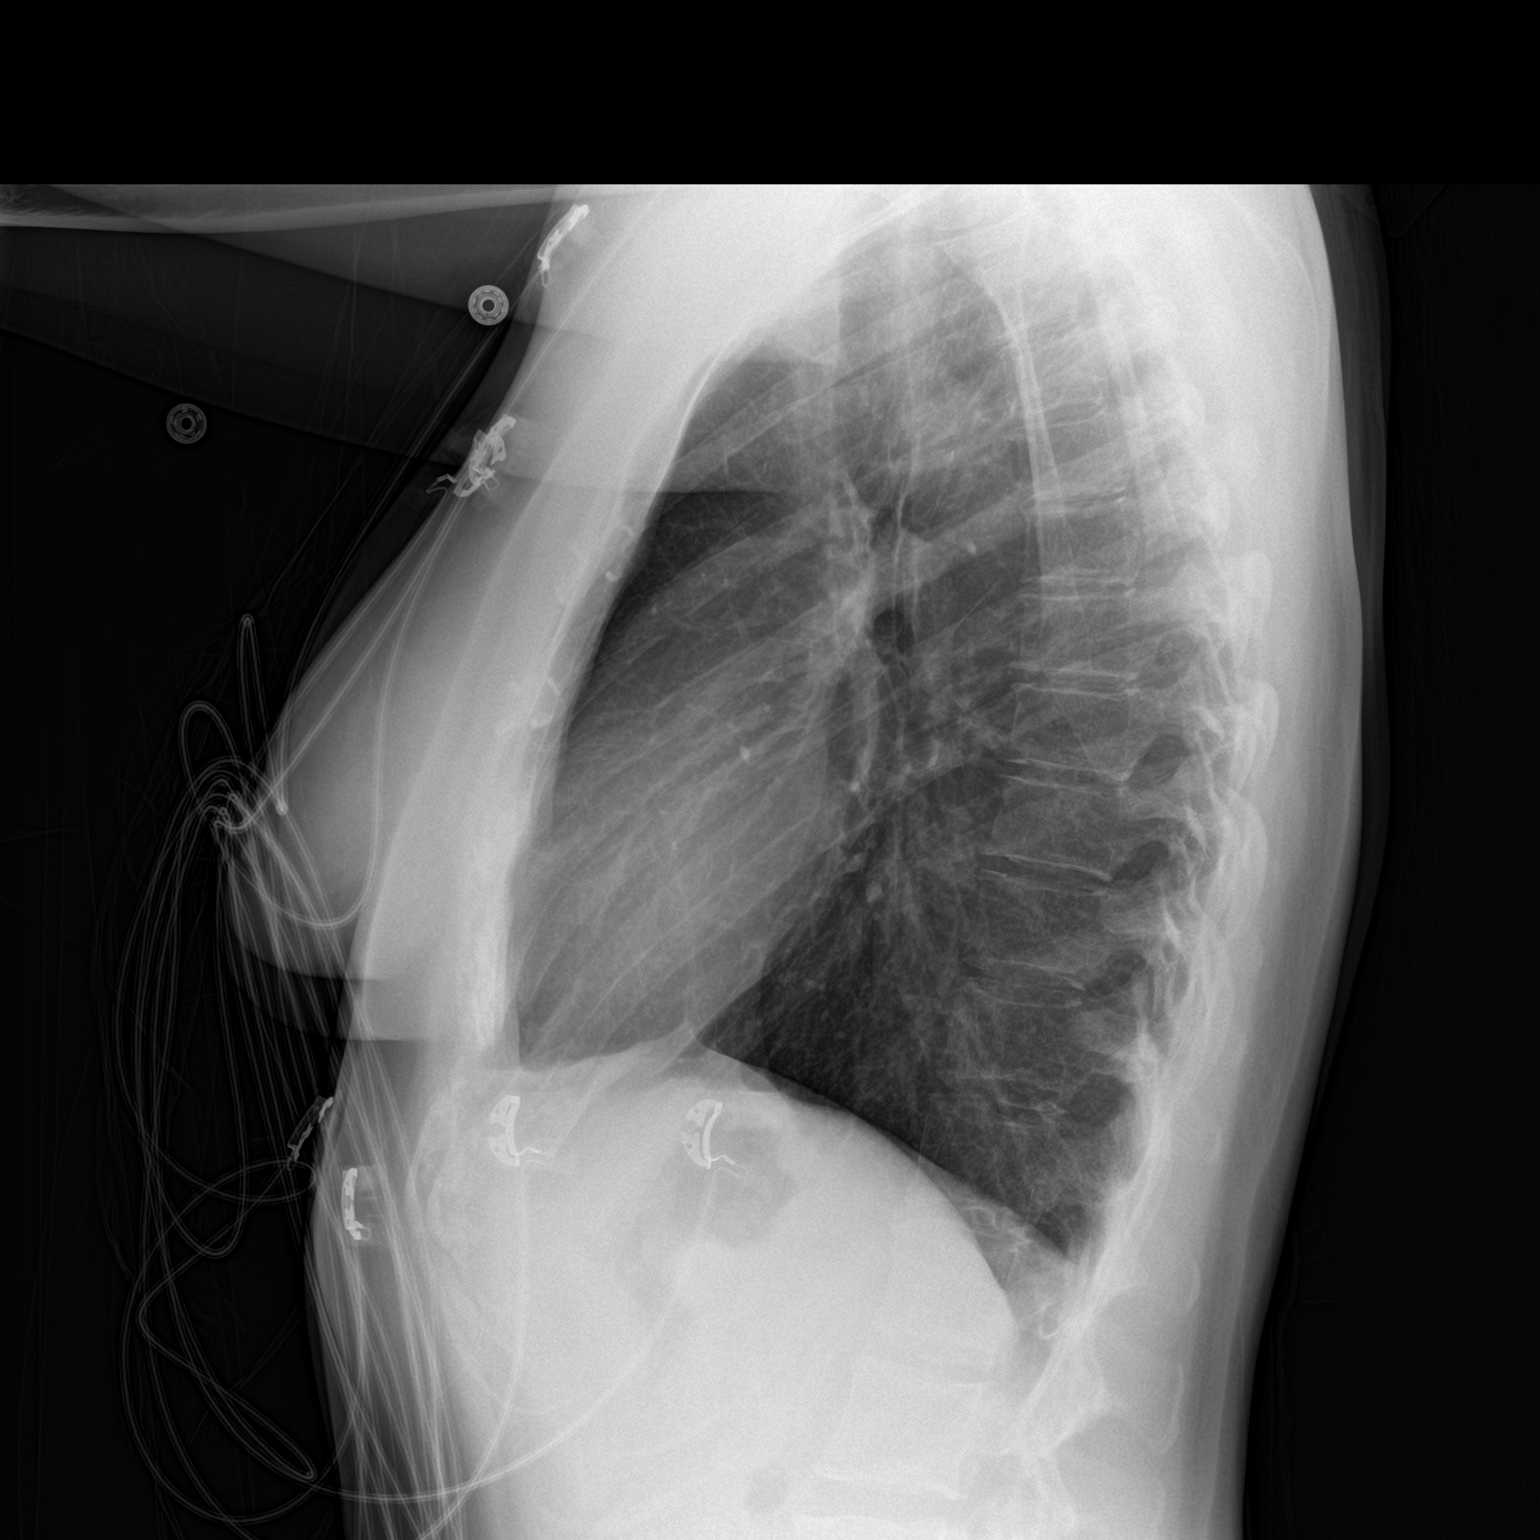

[2 of 2 positions shown; findings below may reference images not displayed]

FINDINGS: The heart size and mediastinal contours are within normal limits.
Both lungs are clear. The visualized skeletal structures are
unremarkable.
IMPRESSION: Negative two view chest x-ray

## 2017-02-07 IMAGING — CT CT ABD-PELV W/ CM
2 of 4 series · 16 of 46 positions shown, 18 images · IV contrast (Omnipaque 300)
Comparison: None.

CLINICAL DATA: Lower abdominal pain for 1 month.

EXAM:
CT ABDOMEN AND PELVIS WITH CONTRAST
TECHNIQUE: Multidetector CT imaging of the abdomen and pelvis was performed
using the standard protocol following bolus administration of
intravenous contrast.
CONTRAST:  50mL OMNIPAQUE IOHEXOL 300 MG/ML SOLN, 100mL OMNIPAQUE
IOHEXOL 300 MG/ML SOLN

[Series 2: abd_pel_with 5.0 b40f · axial · 0.66mm/px · z∈[-476,-72]mm · 13 of 89 slices shown, 15 images]
[im 4/89  soft-tissue]
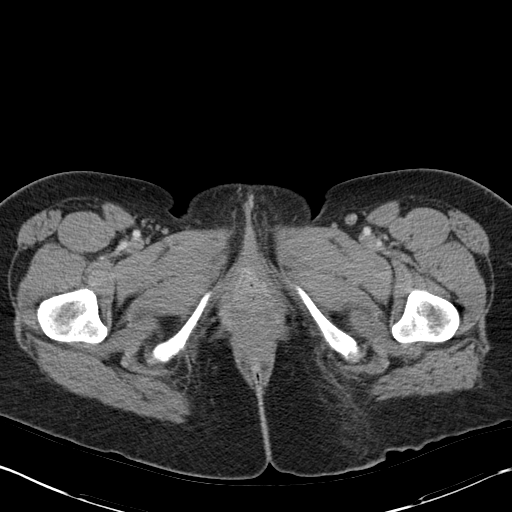
[im 4/89  bone]
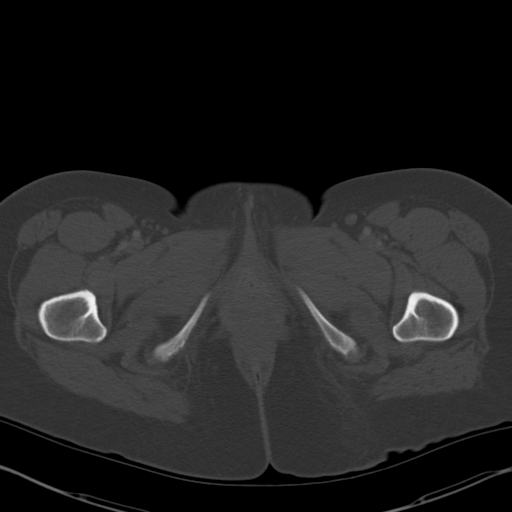
[im 12/89  soft-tissue]
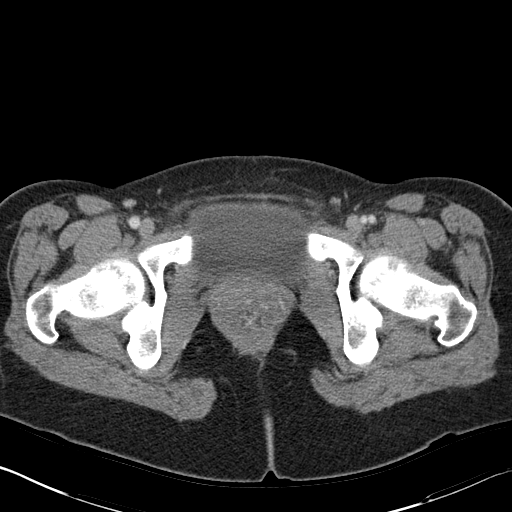
[im 19/89  soft-tissue]
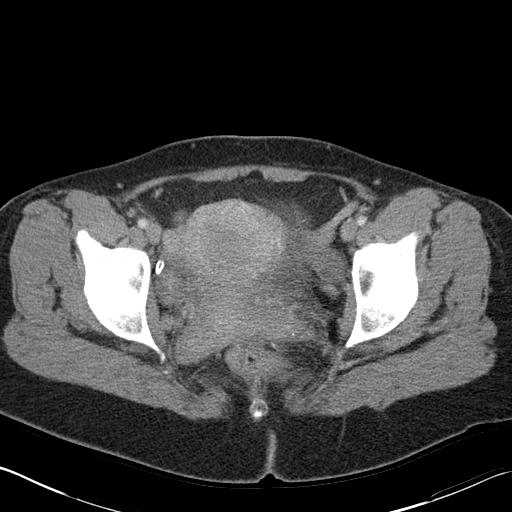
[im 26/89  soft-tissue]
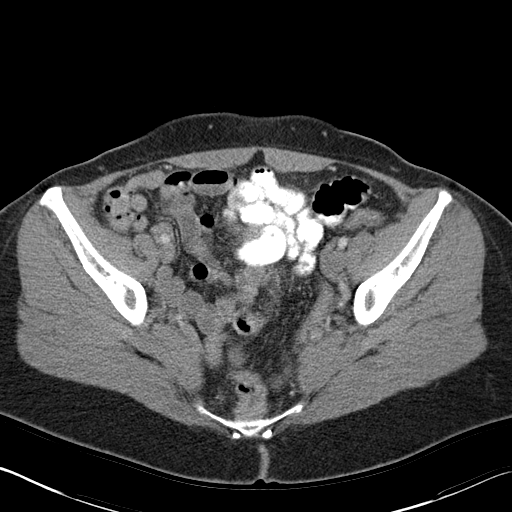
[im 30/89  soft-tissue]
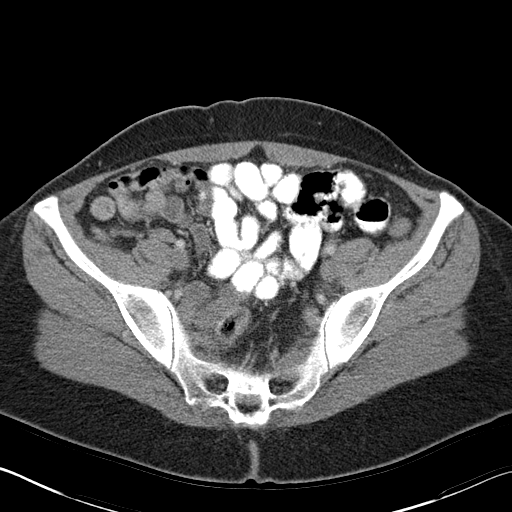
[im 37/89  soft-tissue]
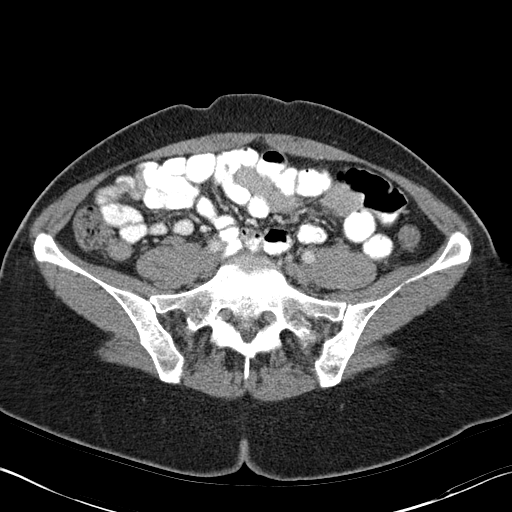
[im 45/89  soft-tissue]
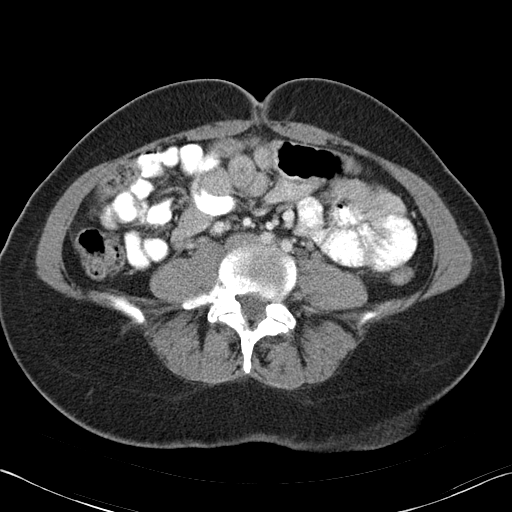
[im 52/89  soft-tissue]
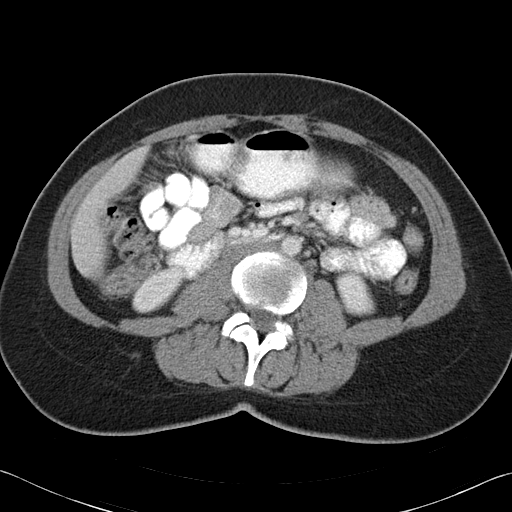
[im 59/89  soft-tissue]
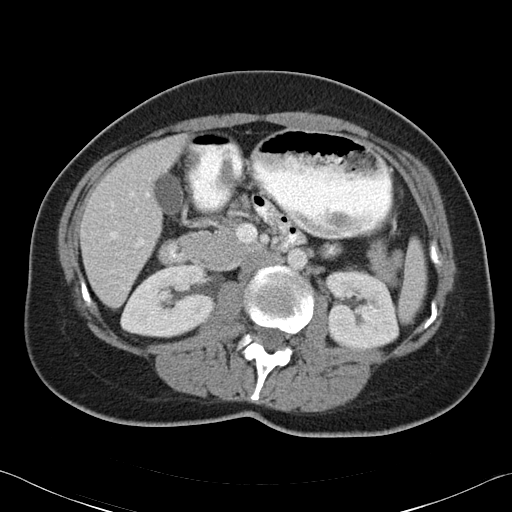
[im 59/89  bone]
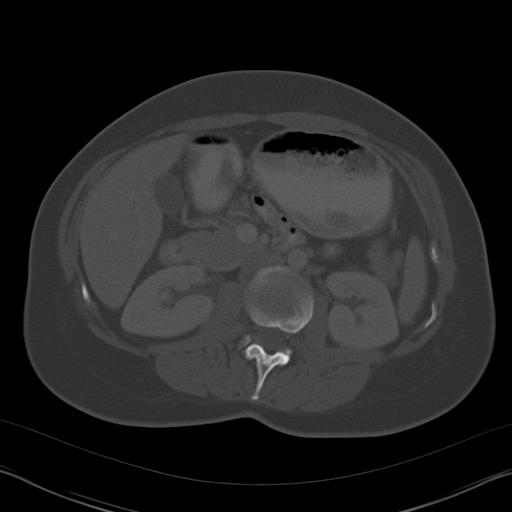
[im 63/89  soft-tissue]
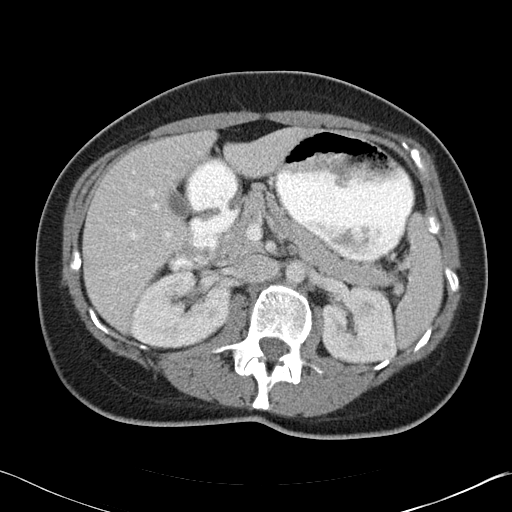
[im 70/89  soft-tissue]
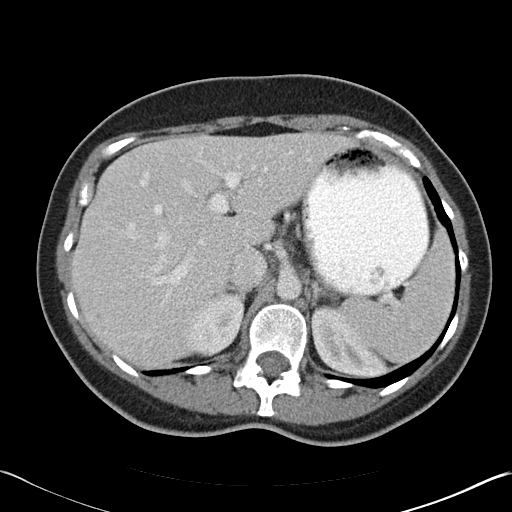
[im 78/89  soft-tissue]
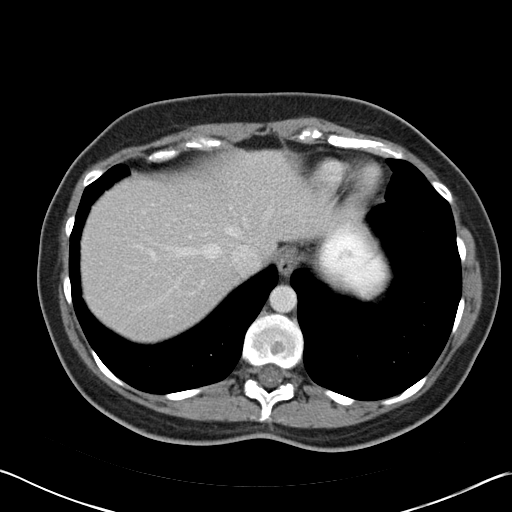
[im 85/89  soft-tissue]
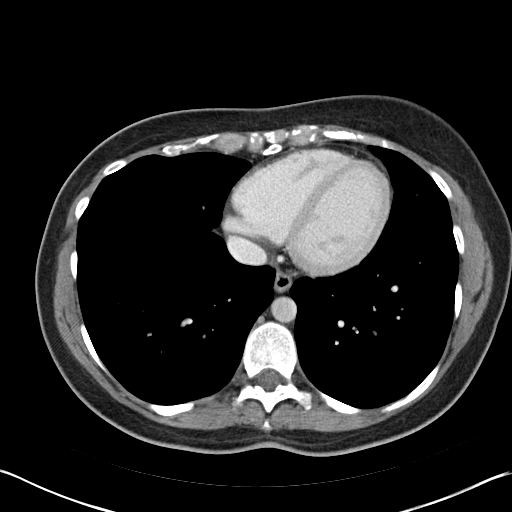

[Series 4: abd_pel_with 3.0 spo · coronal · 0.67mm/px · 3 of 85 slices shown]
[im 29/85  soft-tissue]
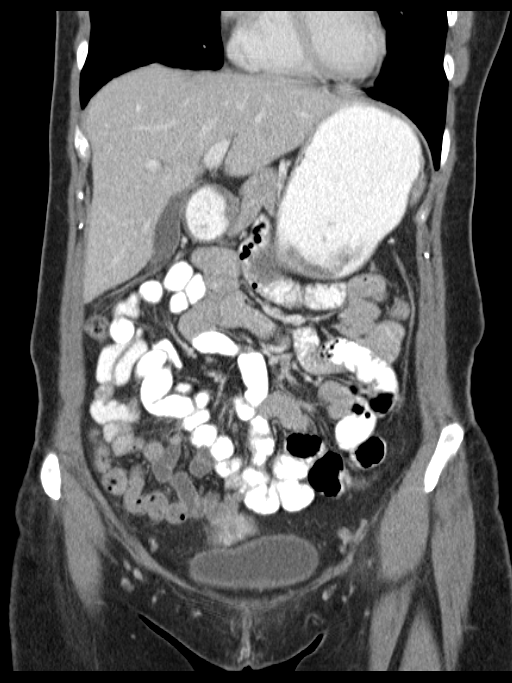
[im 38/85  soft-tissue]
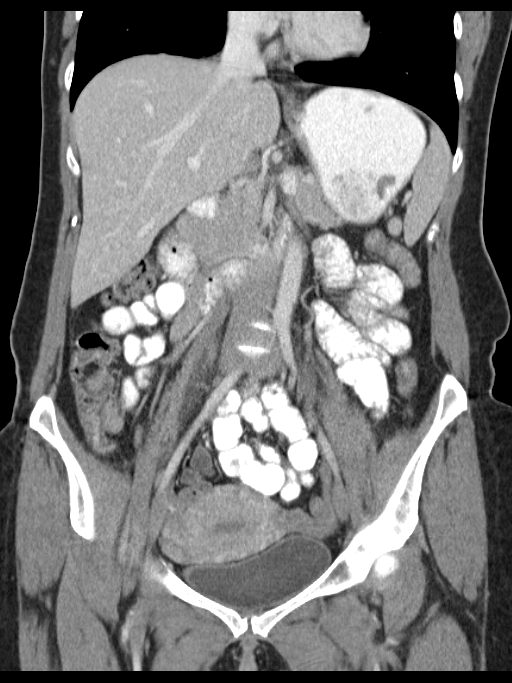
[im 47/85  soft-tissue]
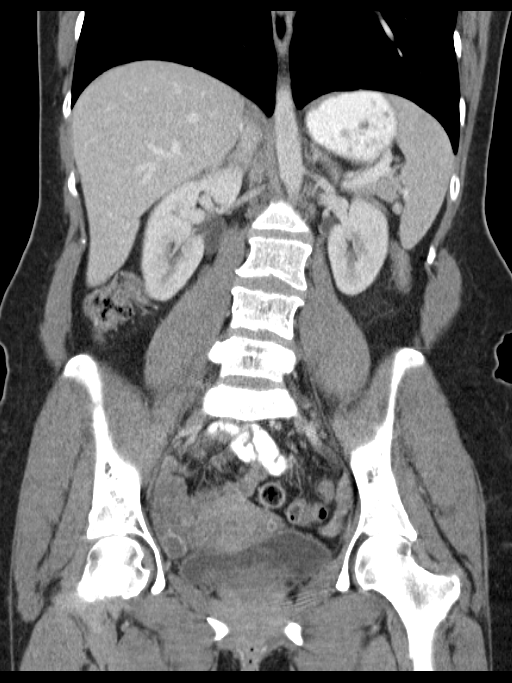

[16 of 46 positions shown; findings below may reference images not displayed]

FINDINGS: Lower chest:  No pleural effusion.  The lung bases appear clear.

Hepatobiliary: Faint low-attenuation structure within the right
hepatic lobe measures 4 mm, image 20/ series 2. This is too small to
characterize. The gallbladder is normal. There is no biliary
dilatation.

Pancreas: Normal appearance of the pancreas.

Spleen: Negative

Adrenals/Urinary Tract: Normal adrenal glands. The kidneys are both
unremarkable. There is no evidence for obstructive uropathy. The
urinary bladder is normal.

Stomach/Bowel: The stomach is within normal limits. The small bowel
loops have a normal course and caliber. No obstruction. Normal
appearance of the colon. The appendix is visualized and appears
normal.

Vascular/Lymphatic: Normal appearance of the abdominal aorta. No
enlarged retroperitoneal or mesenteric adenopathy. No enlarged
pelvic or inguinal lymph nodes.

Reproductive: The uterus appears normal. There is a right ovary
corpus luteal cyst. Calcification within the stress set there is an
ossific density within the right adnexa adjacent to the right ovary
measuring 1.2 cm, image 44 of series 4. Although no fat attenuation
is associated with this structure this may represent and ovarian
dermoid.

Other: There is no ascites or focal fluid collections within the
abdomen or pelvis.

Musculoskeletal: No aggressive lytic or sclerotic bone lesions
identified. Scoliosis deformity is convex towards the left.
IMPRESSION: 1. No acute findings identified within the abdomen or pelvis.
2. Suspect right ovary dermoid.
